# Patient Record
Sex: Female | Born: 1990
Health system: Southern US, Community
[De-identification: ages and names within clinical notes are randomized; demographics above are authoritative.]

---
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
## 2008-11-01 ENCOUNTER — Emergency Department (HOSPITAL_COMMUNITY): Admission: EM | Admit: 2008-11-01 | Discharge: 2008-11-01 | Payer: Self-pay | Admitting: Emergency Medicine

## 2010-07-30 ENCOUNTER — Emergency Department (HOSPITAL_COMMUNITY): Payer: No Typology Code available for payment source

## 2010-07-30 ENCOUNTER — Emergency Department (HOSPITAL_COMMUNITY)
Admission: EM | Admit: 2010-07-30 | Discharge: 2010-07-30 | Disposition: A | Discharge status: Home / Self Care | Payer: No Typology Code available for payment source | Attending: Emergency Medicine | Admitting: Emergency Medicine

## 2010-07-30 DIAGNOSIS — S239XXA Sprain of unspecified parts of thorax, initial encounter: Secondary | ICD-10-CM | POA: Insufficient documentation

## 2010-07-30 DIAGNOSIS — M546 Pain in thoracic spine: Secondary | ICD-10-CM | POA: Insufficient documentation

## 2010-07-30 DIAGNOSIS — M542 Cervicalgia: Secondary | ICD-10-CM | POA: Insufficient documentation

## 2010-07-30 DIAGNOSIS — S139XXA Sprain of joints and ligaments of unspecified parts of neck, initial encounter: Secondary | ICD-10-CM | POA: Insufficient documentation

## 2012-10-17 ENCOUNTER — Emergency Department (HOSPITAL_COMMUNITY): Payer: 59

## 2012-10-17 ENCOUNTER — Emergency Department (HOSPITAL_COMMUNITY)
Admission: EM | Admit: 2012-10-17 | Discharge: 2012-10-17 | Disposition: A | Discharge status: Home / Self Care | Payer: 59 | Attending: Emergency Medicine | Admitting: Emergency Medicine

## 2012-10-17 DIAGNOSIS — K122 Cellulitis and abscess of mouth: Secondary | ICD-10-CM | POA: Insufficient documentation

## 2012-10-17 DIAGNOSIS — K029 Dental caries, unspecified: Secondary | ICD-10-CM | POA: Insufficient documentation

## 2012-10-17 DIAGNOSIS — Z3202 Encounter for pregnancy test, result negative: Secondary | ICD-10-CM | POA: Insufficient documentation

## 2012-10-17 LAB — POCT I-STAT, CHEM 8
Calcium, Ion: 1.21 mmol/L (ref 1.12–1.23)
HCT: 44 % (ref 36.0–46.0)
Hemoglobin: 15 g/dL (ref 12.0–15.0)
Sodium: 139 mEq/L (ref 135–145)
TCO2: 22 mmol/L (ref 0–100)

## 2012-10-17 LAB — PREGNANCY, URINE: Preg Test, Ur: NEGATIVE

## 2012-10-17 MED ORDER — HYDROCODONE-ACETAMINOPHEN 5-325 MG PO TABS: 1.0000 | ORAL_TABLET | ORAL | Status: DC | PRN

## 2012-10-17 MED ORDER — ONDANSETRON HCL 4 MG/2ML IJ SOLN
4.0000 mg | INTRAMUSCULAR | Status: AC
  Administered 2012-10-17: 4 mg via INTRAVENOUS
  Filled 2012-10-17: qty 2

## 2012-10-17 MED ORDER — IOHEXOL 300 MG/ML  SOLN
75.0000 mL | Freq: Once | INTRAMUSCULAR | Status: AC | PRN
  Administered 2012-10-17: 75 mL via INTRAVENOUS

## 2012-10-17 MED ORDER — MORPHINE SULFATE 4 MG/ML IJ SOLN
2.0000 mg | Freq: Once | INTRAMUSCULAR | Status: AC
  Administered 2012-10-17: 2 mg via INTRAVENOUS
  Filled 2012-10-17: qty 1

## 2012-10-17 MED ORDER — OXYCODONE-ACETAMINOPHEN 5-325 MG PO TABS
2.0000 | ORAL_TABLET | Freq: Once | ORAL | Status: AC
  Administered 2012-10-17: 2 via ORAL
  Filled 2012-10-17: qty 2

## 2012-10-17 MED ORDER — CLINDAMYCIN PHOSPHATE 300 MG/50ML IV SOLN
300.0000 mg | Freq: Once | INTRAVENOUS | Status: AC
  Administered 2012-10-17: 300 mg via INTRAVENOUS
  Filled 2012-10-17: qty 50

## 2012-10-17 MED ORDER — CLINDAMYCIN HCL 150 MG PO CAPS: 300.0000 mg | ORAL_CAPSULE | Freq: Three times a day (TID) | ORAL | Status: DC

## 2012-10-17 NOTE — ED Provider Notes (Signed)
CSN: [id number]     Arrival date & time 10/17/12  8413 History     First MD Initiated Contact with Patient 10/17/12 0757     Chief Complaint  Patient presents with  . Facial Swelling   (Consider location/radiation/quality/duration/timing/severity/associated sxs/prior Treatment) HPI Comments: Patient is a 22 year old female who presents for right-sided facial swelling and pain with onset yesterday. Patient states the symptoms have been constant and gradually worsening since onset. She describes the pain as throbbing in nature and nonradiating. Patient has tried Advil without relief of symptoms. She admits to associated right lower gingival tenderness and endorses a history of dental caries, but states that she has not had cavities filled; denies relationship with dentist. Patient denies associated fever, ear pain or discharge, inability to swallow, gingival bleeding, neck pain or stiffness, or trauma.  The history is provided by the patient. No language interpreter was used.   No past medical history on file. No past surgical history on file. No family history on file. History  Substance Use Topics  . Smoking status: Not on file  . Smokeless tobacco: Not on file  . Alcohol Use: Not on file   OB History   No data available     Review of Systems  Constitutional: Negative for fever.  HENT: Positive for facial swelling and dental problem (pain). Negative for ear pain, sore throat, trouble swallowing, neck pain, neck stiffness and ear discharge.   Eyes: Negative for discharge and redness.  Respiratory: Negative for shortness of breath.   Gastrointestinal: Negative for nausea and vomiting.  All other systems reviewed and are negative.   Allergies  Review of patient's allergies indicates not on file.  Home Medications   Current Outpatient Rx  Name  Route  Sig  Dispense  Refill  . ibuprofen (ADVIL,MOTRIN) 200 MG tablet   Oral   Take 400 mg by mouth every 6 (six) hours as needed  for pain.         . clindamycin (CLEOCIN) 150 MG capsule   Oral   Take 2 capsules (300 mg total) by mouth 3 (three) times daily. May dispense as 150mg  capsules   60 capsule   0   . HYDROcodone-acetaminophen (NORCO/VICODIN) 5-325 MG per tablet   Oral   Take 1-2 tablets by mouth every 4 (four) hours as needed for pain.   20 tablet   0    BP 109/65  Pulse 53  Temp(Src) 99.3 F (37.4 C) (Oral)  Resp 18  Ht 5\' 4"  (1.626 m)  SpO2 100%  LMP 09/30/2012  Physical Exam  Constitutional: She is oriented to person, place, and time. She appears well-developed and well-nourished. No distress.  HENT:  Head: Normocephalic and atraumatic. There is trismus in the jaw.  Right Ear: Tympanic membrane, external ear and ear canal normal. No tenderness. No mastoid tenderness.  Left Ear: Tympanic membrane, external ear and ear canal normal. No tenderness. No mastoid tenderness.  Nose: Nose normal.  Mouth/Throat: Uvula is midline and mucous membranes are normal. Abnormal dentition. Dental caries present. No edematous. No oropharyngeal exudate, posterior oropharyngeal edema, posterior oropharyngeal erythema or tonsillar abscesses.    Eyes: Conjunctivae and EOM are normal. Pupils are equal, round, and reactive to light. No scleral icterus.  Neck: Normal range of motion. Neck supple. No tracheal deviation present.  Cardiovascular: Normal rate, regular rhythm and normal heart sounds.   Pulmonary/Chest: Effort normal and breath sounds normal. No stridor. No respiratory distress. She has no wheezes. She has no  rales.  Musculoskeletal: Normal range of motion.  Lymphadenopathy:    She has cervical adenopathy (R anterior cervical).  Neurological: She is alert and oriented to person, place, and time.  Skin: Skin is warm and dry. No rash noted. She is not diaphoretic. No erythema. No pallor.  Psychiatric: She has a normal mood and affect. Her behavior is normal.   ED Course   Procedures (including  critical care time)  Labs Reviewed  PREGNANCY, URINE  POCT I-STAT, CHEM 8   Ct Maxillofacial W/cm  10/17/2012   *RADIOLOGY REPORT*  Clinical Data: Right lower facial swelling  CT MAXILLOFACIAL WITH CONTRAST  Technique:  Multidetector CT imaging of the maxillofacial structures was performed with intravenous contrast. Multiplanar CT image reconstructions were also generated.  Contrast: 75mL OMNIPAQUE IOHEXOL 300 MG/ML  SOLN  Comparison: Cervical spine radiographs 07/30/2010  Findings: Partial imaging of the brain demonstrates no focal abnormality.  Globes and orbits are intact.  Diffuse skin thickening, edema and reticulation in the subcutaneous fat overlying the right mandible and extending upward along the mandibular ramus and mastoid air musculature consistent with diffuse cellulitis.  The epicenter of the soft tissue swelling appears to be of the right 2nd molar which is diffusely carious containing floccular material within the crown.  Additionally, there is focal peri apical lucency about the root which breaks through the medial cortex of the mandibular ramus consistent with periodontal disease.  Small amount of developing low attenuation with a peripheral rim of enhancement about the right mandible in the region of the second molar consistent with phlegmon or early abscess development. Numerous additional carious teeth, but no other areas of significant periodontal disease.  IMPRESSION:  1. Diffuse soft tissue cellulitis and edema involving the right perimandibular soft tissues extending upward along the masseter muscle.  The infectious process appears to be emanating from a carious right second maxillary molar with associated periodontal disease breaking through the medial cortex of the mandible.  Small amount of adjacent focal phlegmon versus early developing abscess.  2.  Multiple additional carious teeth without associated periodontal disease.   Original Report Authenticated By: Malachy Moan, M.D.    1. Cellulitis and abscess of oral soft tissues    MDM  R facial swelling; worsening x 2 days. No fever. Exam significant for TTP of R lower 2nd and 1st molars as well as surrounding gingiva. +Trismus. CT maxillofacial ordered.  CT significiant for perimandibular soft tissue cellulitis extending along masseter muscle; eminating from carious R 2nd molar. Findings suggestive of possible early developing abscess. Will consult oral surgery to arrange close outpatient f/u. Anticipate d/c home with necessary referral and follow up as well as course of PO clindamycin.  Have consulted with Dr. Jeanice Lim who has advised patient call office at [facility] Monday for follow up in office later that day; agrees patient appropriate for d/c. Patient appropriate for d/c with Rx for Cleocin for cellulitis and Norco for pain control. Indications for return provided and discussed and patient agreeable to plan.   Antony Madura, PA-C 10/18/12 1600

## 2012-10-17 NOTE — ED Notes (Signed)
Pt c/o R facial swelling & pain onset yesterday, denies difficulty swollowing or throat pain, tonsils non red & no swelling present, all teeth intact, R lower gum pain, no gum swelling, areas around teeth appear red, gums have some white patches in places around gum line on lower gum

## 2012-10-19 NOTE — ED Provider Notes (Signed)
Medical screening examination/treatment/procedure(s) were performed by non-physician practitioner and as supervising physician I was immediately available for consultation/collaboration.  Flint Melter, MD 10/19/12 1007

## 2013-02-17 ENCOUNTER — Ambulatory Visit (INDEPENDENT_AMBULATORY_CARE_PROVIDER_SITE_OTHER): Payer: Self-pay | Admitting: Emergency Medicine

## 2013-02-17 VITALS — BP 120/76 | HR 66 | Temp 98.0°F | Resp 16 | Ht 66.0 in | Wt 171.4 lb

## 2013-02-17 DIAGNOSIS — H109 Unspecified conjunctivitis: Secondary | ICD-10-CM

## 2013-02-17 MED ORDER — OFLOXACIN 0.3 % OP SOLN: 1.0000 [drp] | OPHTHALMIC | Status: DC

## 2013-02-17 NOTE — Patient Instructions (Signed)

## 2013-02-17 NOTE — Progress Notes (Signed)
   Subjective:  This chart was scribed for Viviann Spare A. Cleta Alberts, MD  by Ashley Jacobs, [facility] Scribe. The patient was seen in room and the patient's care was started at 3:22 PM  Patient ID: Danasha Melman, female    DOB: 1990-03-14, 22 y.o.   MRN: [id number]  HPI HPI Comments: Rashida Ladouceur is a 22 y.o. female who arrives to the [facility] complaining of left eye swelling past two days. She states feeling itching and burning around her eye and "inside" of her eye.Pt is also experiencing eye drainage. She has tried Clear Eye without any relief. Pt had an URI two weeks ago. She denies any disturbances to her vision. Pt smokes everyday.    Review of Systems      Objective:   Physical Exam are equal and reactive to light there is mild injection of the conjunctiva of the right the right lower lid is somewhat swollen. 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 There is no preauricular node fluorescin staining was negative.        Assessment & Plan:  Patient will be treated with Ocuflox. She was advised this may take 5-7 days to resolve. If she is not feeling better by Monday she is to call and we can make referral to the ophthalmologist .

## 2013-02-19 ENCOUNTER — Telehealth: Payer: Self-pay

## 2013-02-19 NOTE — Telephone Encounter (Signed)
Patient says that her eye is still red from pink eye and needs and extension on her work note please call her at [phone]

## 2013-02-22 NOTE — Telephone Encounter (Signed)
What dates does she need? Left message for her to call me back.

## 2013-02-24 NOTE — Telephone Encounter (Signed)
Patient has not returned calls.

## 2013-09-03 IMAGING — CT CT MAXILLOFACIAL W/ CM
3 of 4 series · 15 of 47 positions shown, 18 images · IV contrast (omnipaque)
Comparison: Cervical spine radiographs 07/30/2010

CLINICAL DATA: Right lower facial swelling

CT MAXILLOFACIAL WITH CONTRAST
TECHNIQUE: Multidetector CT imaging of the maxillofacial
structures was performed with intravenous contrast. Multiplanar CT
image reconstructions were also generated.
Contrast: 75mL OMNIPAQUE IOHEXOL 300 MG/ML  SOLN

[Series 2: facial/ orbits 2.0 h30s · axial · 0.36mm/px · z∈[-214,-64]mm · 11 of 87 slices shown, 14 images]
[im 6/87  brain]
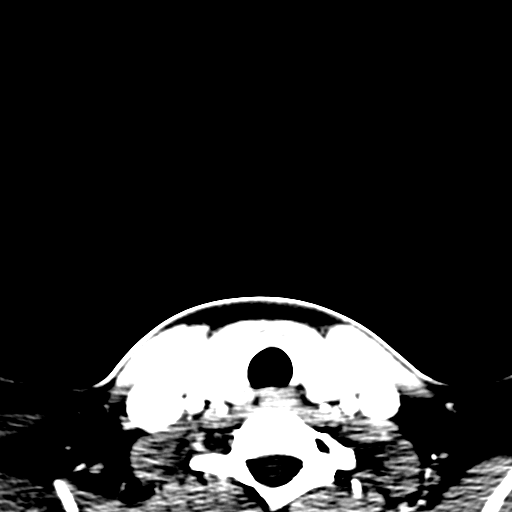
[im 6/87  bone]
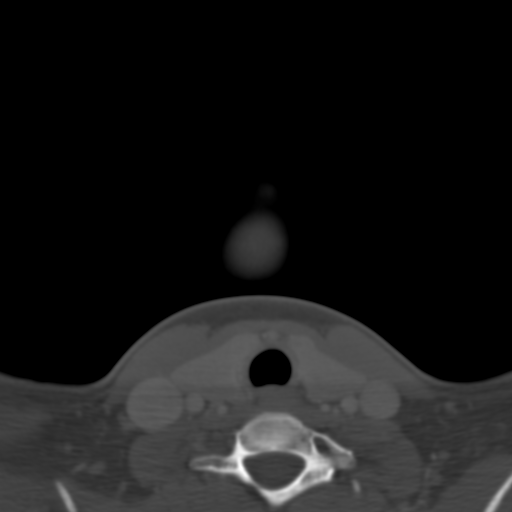
[im 12/87  bone]
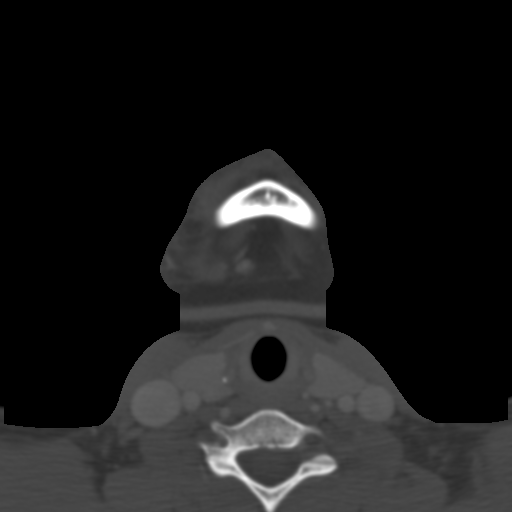
[im 21/87  bone]
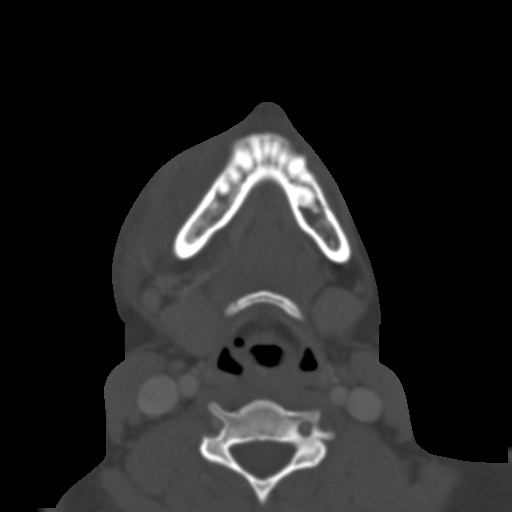
[im 27/87  bone]
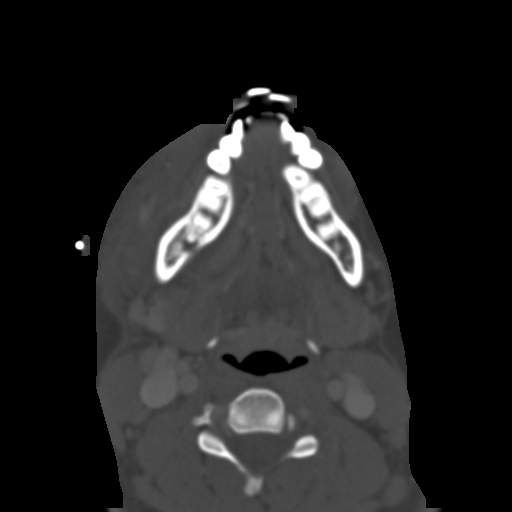
[im 36/87  brain]
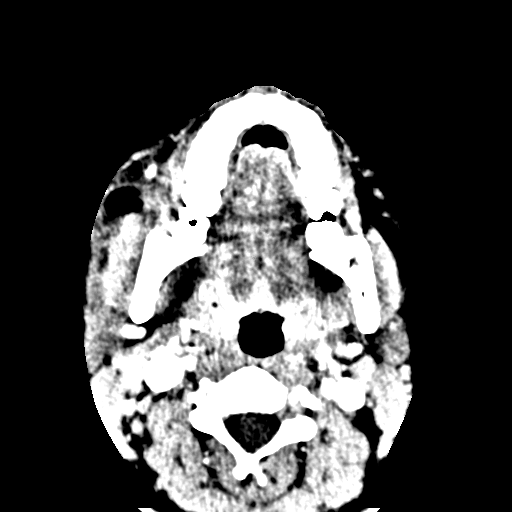
[im 36/87  bone]
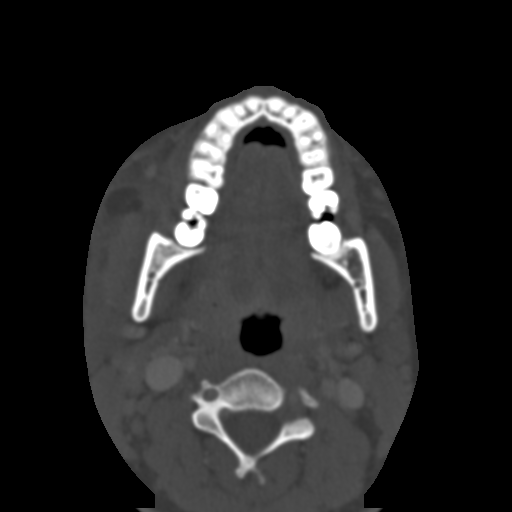
[im 45/87  bone]
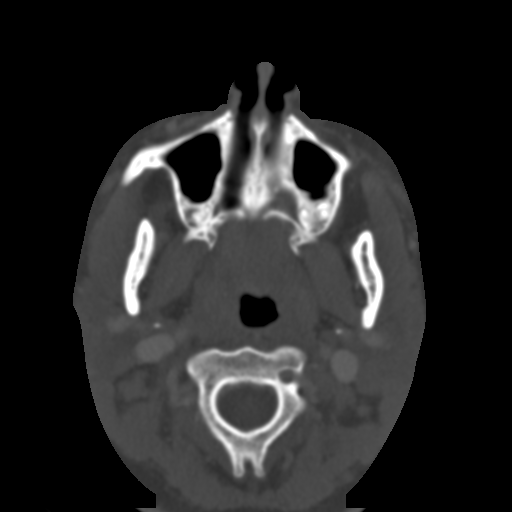
[im 51/87  bone]
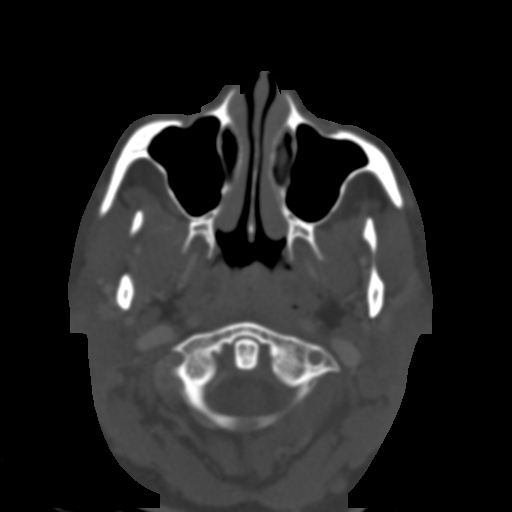
[im 60/87  bone]
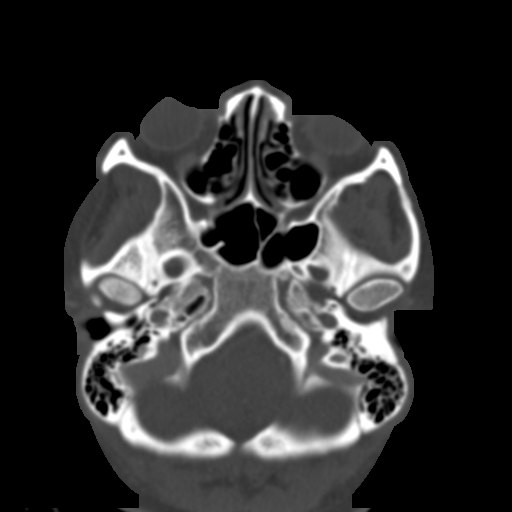
[im 66/87  brain]
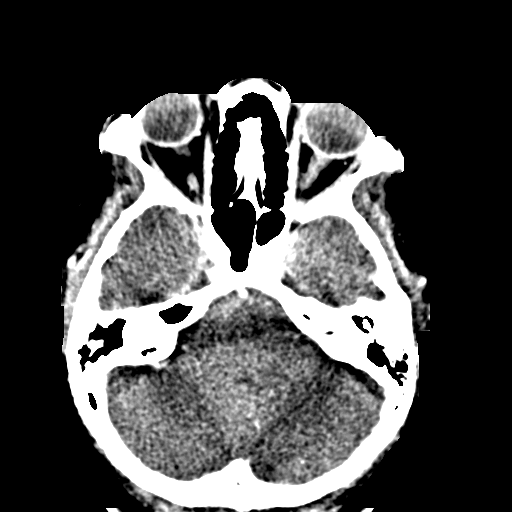
[im 66/87  bone]
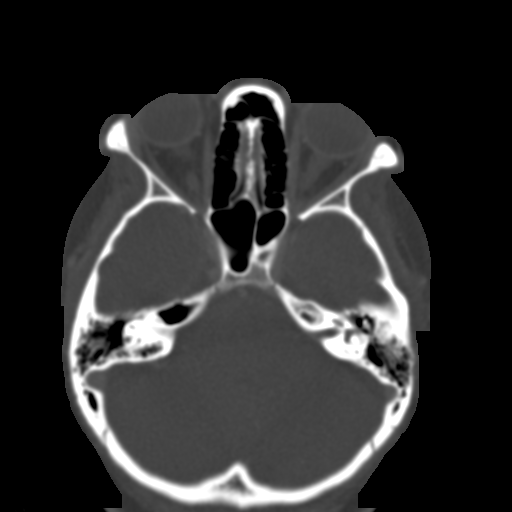
[im 75/87  bone]
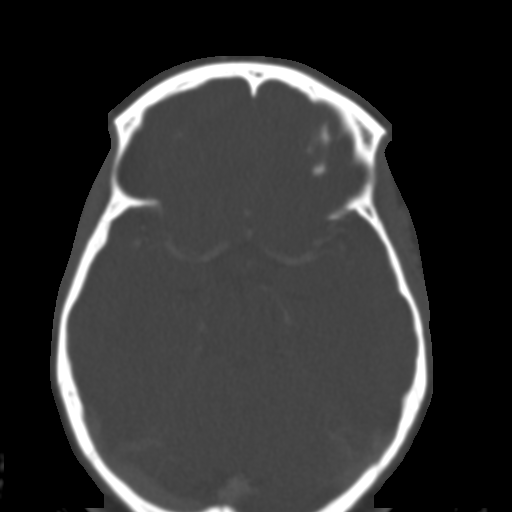
[im 81/87  bone]
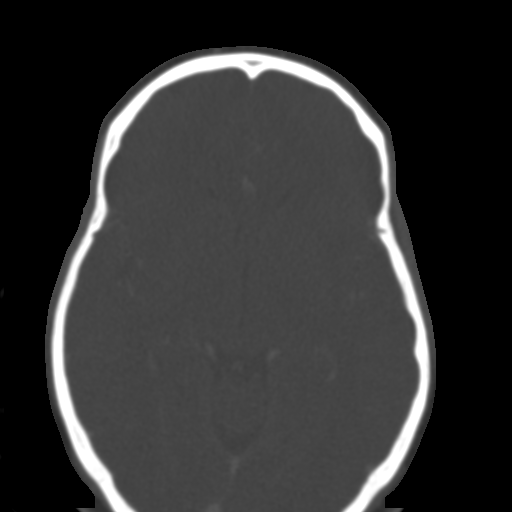

[Series 4: coronal st · coronal · 0.34mm/px · 3 of 78 slices shown]
[im 26/78  bone]
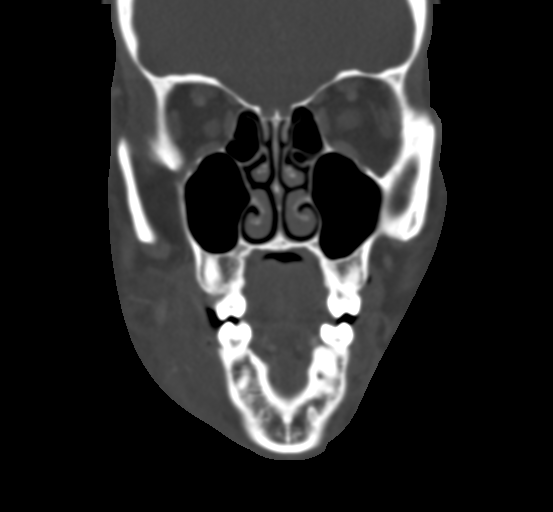
[im 35/78  bone]
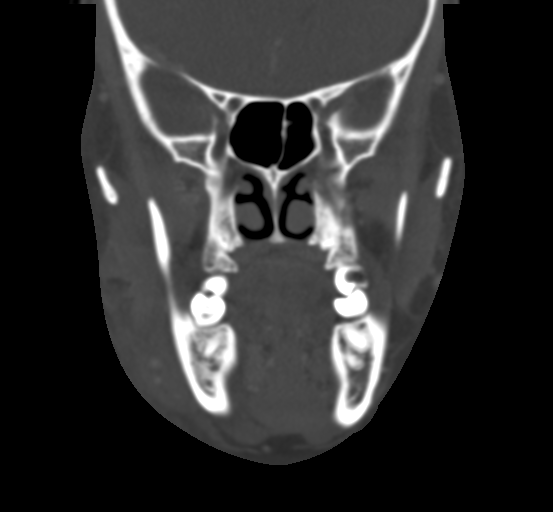
[im 43/78  bone]
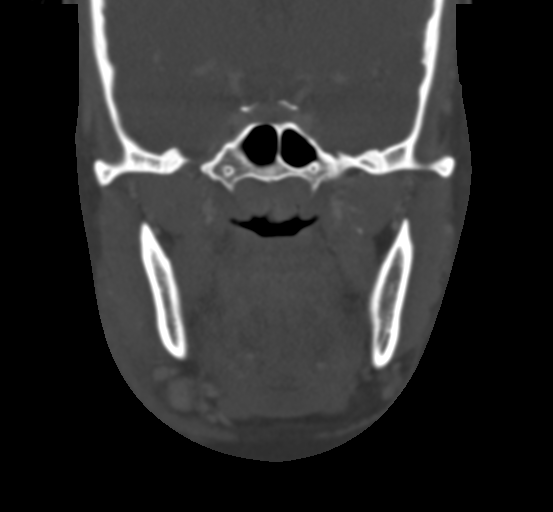

[Series 7: sagittal bone · sagittal · 0.34mm/px · 1 of 80 slices shown]
[im 40/80  bone]
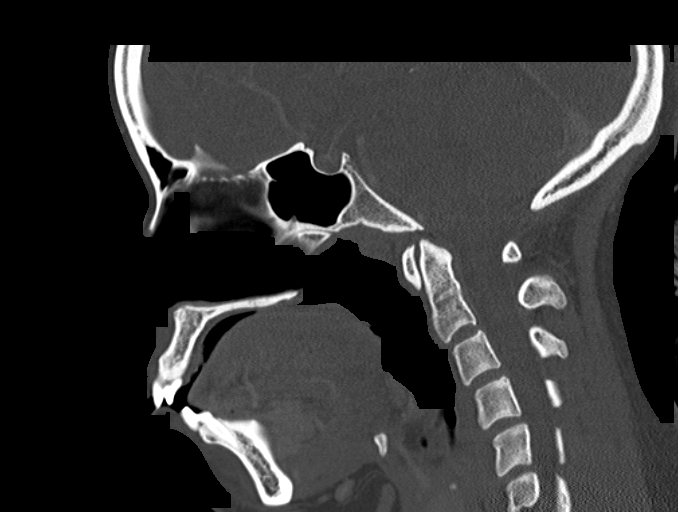

[15 of 47 positions shown; findings below may reference images not displayed]

FINDINGS: Partial imaging of the brain demonstrates no focal
abnormality.  Globes and orbits are intact.  Diffuse skin
thickening, edema and reticulation in the subcutaneous fat
overlying the right mandible and extending upward along the
mandibular ramus and mastoid air musculature consistent with
diffuse cellulitis.  The epicenter of the soft tissue swelling
appears to be of the right 2nd molar which is diffusely carious
containing floccular material within the crown.  Additionally,
there is focal peri apical lucency about the root which breaks
through the medial cortex of the mandibular ramus consistent with
periodontal disease.

Small amount of developing low attenuation with a peripheral rim of
enhancement about the right mandible in the region of the second
molar consistent with phlegmon or early abscess development.
Numerous additional carious teeth, but no other areas of
significant periodontal disease.
IMPRESSION: 1. Diffuse soft tissue cellulitis and edema involving the right
perimandibular soft tissues extending upward along the masseter
muscle.

The infectious process appears to be emanating from a carious right
second maxillary molar with associated periodontal disease breaking
through the medial cortex of the mandible.

Small amount of adjacent focal phlegmon versus early developing
abscess.

2.  Multiple additional carious teeth without associated
periodontal disease.

## 2013-09-23 ENCOUNTER — Encounter (HOSPITAL_COMMUNITY): Payer: Self-pay | Admitting: Emergency Medicine

## 2013-09-23 ENCOUNTER — Emergency Department (HOSPITAL_COMMUNITY)
Admission: EM | Admit: 2013-09-23 | Discharge: 2013-09-23 | Disposition: A | Discharge status: Home / Self Care | Payer: 59 | Attending: Emergency Medicine | Admitting: Emergency Medicine

## 2013-09-23 DIAGNOSIS — G479 Sleep disorder, unspecified: Secondary | ICD-10-CM | POA: Insufficient documentation

## 2013-09-23 DIAGNOSIS — K089 Disorder of teeth and supporting structures, unspecified: Secondary | ICD-10-CM | POA: Insufficient documentation

## 2013-09-23 DIAGNOSIS — K0889 Other specified disorders of teeth and supporting structures: Secondary | ICD-10-CM

## 2013-09-23 DIAGNOSIS — F17200 Nicotine dependence, unspecified, uncomplicated: Secondary | ICD-10-CM | POA: Insufficient documentation

## 2013-09-23 MED ORDER — HYDROCODONE-ACETAMINOPHEN 5-325 MG PO TABS
2.0000 | ORAL_TABLET | Freq: Once | ORAL | Status: AC
  Administered 2013-09-23: 2 via ORAL
  Filled 2013-09-23: qty 2

## 2013-09-23 MED ORDER — PENICILLIN V POTASSIUM 250 MG PO TABS: 250.0000 mg | ORAL_TABLET | Freq: Four times a day (QID) | ORAL | Status: AC

## 2013-09-23 MED ORDER — HYDROCODONE-ACETAMINOPHEN 5-325 MG PO TABS: 1.0000 | ORAL_TABLET | Freq: Four times a day (QID) | ORAL | Status: DC | PRN

## 2013-09-23 NOTE — ED Provider Notes (Signed)
Medical screening examination/treatment/procedure(s) were performed by non-physician practitioner and as supervising physician I was immediately available for consultation/collaboration.   EKG Interpretation None        Tonnie Stillman, MD 09/23/13 0533 

## 2013-09-23 NOTE — ED Provider Notes (Signed)
CSN: [id number]     Arrival date & time 09/23/13  0146 History   First MD Initiated Contact with Patient 09/23/13 [phone]     Chief Complaint  Patient presents with  . Dental Pain     (Consider location/radiation/quality/duration/timing/severity/associated sxs/prior Treatment) HPI Comments: Patient presents to the emergency department with a dental complaint. Symptoms began 2 days ago. The patient has tried to alleviate pain with OTC meds.  Pain rated at a 10/10, characterized as throbbing in nature and located right lower rear molar. Patient denies fever, night sweats, chills, difficulty swallowing or opening mouth, SOB, nuchal rigidity or decreased ROM of neck.  Patient does not have a dentist and requests a resource guide at discharge.   The history is provided by the patient. No language interpreter was used.    History reviewed. No pertinent past medical history. History reviewed. No pertinent past surgical history. No family history on file. History  Substance Use Topics  . Smoking status: Current Every Day Smoker  . Smokeless tobacco: Not on file  . Alcohol Use: Yes   OB History   Grav Para Term Preterm Abortions TAB SAB Ect Mult Living                 Review of Systems  Constitutional: Negative for fever and chills.  HENT: Positive for dental problem. Negative for drooling.   Neurological: Negative for speech difficulty.  Psychiatric/Behavioral: Positive for sleep disturbance.      Allergies  Review of patient's allergies indicates no known allergies.  Home Medications   Prior to Admission medications   Medication Sig Start Date End Date Taking? Authorizing Provider  OVER THE COUNTER MEDICATION Take 1 tablet by mouth daily as needed (pain).   Yes Historical Provider, MD   BP 109/68  Pulse 61  Temp(Src) 98.3 F (36.8 C) (Oral)  Resp 14  Ht 5\' 4"  (1.626 m)  Wt 173 lb (78.472 kg)  BMI 29.68 kg/m2  SpO2 100%  LMP 08/29/2013 Physical Exam  Nursing note and  vitals reviewed. Constitutional: She is oriented to person, place, and time. She appears well-developed and well-nourished.  HENT:  Head: Normocephalic and atraumatic.  Mouth/Throat:    Poor dentition throughout.  Affected tooth as diagrammed.  No signs of peritonsillar or tonsillar abscess.  No signs of gingival abscess. Oropharynx is clear and without exudates.  Uvula is midline.  Airway is intact. No signs of Ludwig's angina with palpation of oral and sublingual mucosa.   Eyes: Conjunctivae and EOM are normal.  Neck: Normal range of motion.  Cardiovascular: Normal rate.   Pulmonary/Chest: Effort normal.  Abdominal: She exhibits no distension.  Musculoskeletal: Normal range of motion.  Neurological: She is alert and oriented to person, place, and time.  Skin: Skin is dry.  Psychiatric: She has a normal mood and affect. Her behavior is normal. Judgment and thought content normal.    ED Course  Procedures (including critical care time) Labs Review Labs Reviewed - No data to display  Imaging Review No results found.   EKG Interpretation None      MDM   Final diagnoses:  Pain, dental    Patient with toothache.  No gross abscess.  Exam unconcerning for Ludwig's angina or spread of infection.  Will treat with penicillin and pain medicine.  Urged patient to follow-up with dentist.      Roxy Horsemanobert Kalkidan Caudell, PA-C 09/23/13 0505

## 2013-09-23 NOTE — Discharge Instructions (Signed)

## 2013-09-23 NOTE — ED Notes (Signed)
Pt. reports right lower molar pain onset 2 days ago unrelieved by OTC pain medication .

## 2015-02-04 ENCOUNTER — Encounter (HOSPITAL_COMMUNITY): Payer: Self-pay | Admitting: Neurology

## 2015-02-04 ENCOUNTER — Emergency Department (HOSPITAL_COMMUNITY)
Admission: EM | Admit: 2015-02-04 | Discharge: 2015-02-04 | Disposition: A | Discharge status: Home / Self Care | Payer: Self-pay | Attending: Emergency Medicine | Admitting: Emergency Medicine

## 2015-02-04 DIAGNOSIS — F17200 Nicotine dependence, unspecified, uncomplicated: Secondary | ICD-10-CM | POA: Insufficient documentation

## 2015-02-04 DIAGNOSIS — R600 Localized edema: Secondary | ICD-10-CM | POA: Insufficient documentation

## 2015-02-04 DIAGNOSIS — K029 Dental caries, unspecified: Secondary | ICD-10-CM | POA: Insufficient documentation

## 2015-02-04 DIAGNOSIS — K0889 Other specified disorders of teeth and supporting structures: Secondary | ICD-10-CM | POA: Insufficient documentation

## 2015-02-04 DIAGNOSIS — R51 Headache: Secondary | ICD-10-CM | POA: Insufficient documentation

## 2015-02-04 DIAGNOSIS — Z791 Long term (current) use of non-steroidal anti-inflammatories (NSAID): Secondary | ICD-10-CM | POA: Insufficient documentation

## 2015-02-04 DIAGNOSIS — K0381 Cracked tooth: Secondary | ICD-10-CM | POA: Insufficient documentation

## 2015-02-04 MED ORDER — IBUPROFEN 800 MG PO TABS: 800.0000 mg | ORAL_TABLET | Freq: Three times a day (TID) | ORAL | Status: DC

## 2015-02-04 MED ORDER — PENICILLIN V POTASSIUM 500 MG PO TABS: 500.0000 mg | ORAL_TABLET | Freq: Four times a day (QID) | ORAL | Status: AC

## 2015-02-04 NOTE — Discharge Instructions (Signed)
Follow-up with Dr. Mayford Knifeurner on Monday for further evaluation and management of your dental pain. Take your antibiotics as prescribed. Take Motrin as needed for dental pain. Return to ED for worsening symptoms.

## 2015-02-04 NOTE — ED Provider Notes (Signed)
CSN: [id number]     Arrival date & time 02/04/15  1229 History  By signing my name below, I, Soijett Blue, attest that this documentation has been prepared under the direction and in the presence of Mayme Genta, [employer] Electronically Signed: Soijett Blue, ED Scribe. 02/04/2015. 1:37 PM.   Chief Complaint  Patient presents with  . Dental Pain      The history is provided by the patient. No language interpreter was used.    Anne Thompson is a 24 y.o. female who presents to the Emergency Department complaining of 9/10, intermittent dental pain onset 3 days. She states that she is having associated symptoms of mild facial swelling and HA. She states that she has tried aleve with mild relief for her symptoms. She denies drainage, abdominal pain, n/v, sore throat, fever, chills, voice change, trismus, and any other symptoms. Pt denies allergies to any medications.    History reviewed. No pertinent past medical history. History reviewed. No pertinent past surgical history. No family history on file. Social History  Substance Use Topics  . Smoking status: Current Every Day Smoker  . Smokeless tobacco: None  . Alcohol Use: Yes   OB History    No data available     Review of Systems  Constitutional: Negative for fever and chills.  HENT: Positive for dental problem and facial swelling. Negative for trouble swallowing.   Gastrointestinal: Negative for nausea, vomiting and anal bleeding.  Neurological: Positive for headaches.      Allergies  Review of patient's allergies indicates no known allergies.  Home Medications   Prior to Admission medications   Medication Sig Start Date End Date Taking? Authorizing Provider  HYDROcodone-acetaminophen (NORCO/VICODIN) 5-325 MG per tablet Take 1-2 tablets by mouth every 6 (six) hours as needed. 09/23/13   Roxy Horseman, PA-C  ibuprofen (ADVIL,MOTRIN) 800 MG tablet Take 1 tablet (800 mg total) by mouth 3 (three) times daily. 02/04/15   Joycie Peek, PA-C  OVER THE COUNTER MEDICATION Take 1 tablet by mouth daily as needed (pain).    Historical Provider, MD  penicillin v potassium (VEETID) 500 MG tablet Take 1 tablet (500 mg total) by mouth 4 (four) times daily. 02/04/15 02/11/15  Huberta Tompkins, PA-C   BP 102/61 mmHg  Pulse 64  Temp(Src) 97.8 F (36.6 C) (Oral)  Resp 14  SpO2 97%  LMP 01/21/2015 Physical Exam  Constitutional: She is oriented to person, place, and time. She appears well-developed and well-nourished. No distress.  HENT:  Head: Normocephalic and atraumatic.  Mouth/Throat: Oropharynx is clear and moist. No trismus in the jaw. Abnormal dentition. Dental caries present. No dental abscesses.  Overall poor dentition with multiple fractured teeth and active caries. Discomfort located to the left mandibular molars. No evidence of obvious abscess. No Ludwig's angina. No glossal elevation. No trismus. No obvious abscess. Patent airway.   Eyes: EOM are normal.  Neck: Neck supple.  Cardiovascular: Normal rate, regular rhythm and normal heart sounds.  Exam reveals no gallop and no friction rub.   No murmur heard. Pulmonary/Chest: Effort normal and breath sounds normal. No respiratory distress. She has no wheezes. She has no rales.  Abdominal: Soft. There is no tenderness.  Musculoskeletal: Normal range of motion.  Lymphadenopathy:    She has no cervical adenopathy.  Neurological: She is alert and oriented to person, place, and time.  Skin: Skin is warm and dry. She is not diaphoretic.  Psychiatric: She has a normal mood and affect. Her behavior is normal.  Nursing note and vitals reviewed.   ED Course  Procedures (including critical care time) DIAGNOSTIC STUDIES: Oxygen Saturation is 97% on RA, nl by my interpretation.    COORDINATION OF CARE: 1:34 PM Discussed treatment plan with pt at bedside which includes motrin Rx, abx Rx, referral to dentist and pt agreed to plan.  1:34 PM- Pt was offered an oral dental  block by Mayme GentaBen Adesuwa Osgood, PA-C to which she declined.    Labs Review Labs Reviewed - No data to display  Imaging Review No results found. I have personally reviewed and evaluated these images and lab results as part of my medical decision-making.   EKG Interpretation None     Medications - No data to display Filed Vitals:   02/04/15 1238  BP: 102/61  Pulse: 64  Temp: 97.8 F (36.6 C)  TempSrc: Oral  Resp: 14  SpO2: 97%   Meds given in ED:  Medications - No data to display  New Prescriptions   IBUPROFEN (ADVIL,MOTRIN) 800 MG TABLET    Take 1 tablet (800 mg total) by mouth 3 (three) times daily.   PENICILLIN V POTASSIUM (VEETID) 500 MG TABLET    Take 1 tablet (500 mg total) by mouth 4 (four) times daily.     MDM  Tyler AasBrianca Brunke is a 24 y.o. female who presents with dental pain onset 3 days ago. No dentist. No evidence of abscess, retropharyngeal or peritonsillar abscess, Ludwig angina. Patient refuses oral nerve block. Will DC with antibiotics and ibuprofen. Referral to dentistry given, encouraged to call on Monday for follow-up and definitive care. Patient agrees with this plan. No evidence of other acute or emergent pathology. Patient appears well, nontoxic, hemodynamically stable and afebrile. Appropriate for discharge. Final diagnoses:  Pain, dental      Joycie PeekBenjamin Teana Lindahl, PA-C 02/04/15 1407  Pricilla LovelessScott Goldston, MD 02/04/15 [phone]

## 2015-02-04 NOTE — ED Notes (Signed)
Pt is in stable condition upon d/c and ambulates from ED. Upon d/c pt is very upset and states, "So, you aren't gonna offer me anything for pain?". The pt was informed she was offered a nerve block to take away her pain for up to 8 hours. Pt states she didn't want any needles she wanted to take a pill for her pain. Pt was offered ibuprofen, which the pt refused and states she wanted something stronger for pain.

## 2015-02-04 NOTE — ED Notes (Signed)
Pt reports left upper and lower toothache since Thursday. Is having a lot of pain, has not seen a dentist.

## 2016-09-03 ENCOUNTER — Encounter (HOSPITAL_BASED_OUTPATIENT_CLINIC_OR_DEPARTMENT_OTHER): Payer: Self-pay | Admitting: *Deleted

## 2016-09-03 ENCOUNTER — Emergency Department (HOSPITAL_BASED_OUTPATIENT_CLINIC_OR_DEPARTMENT_OTHER)
Admission: EM | Admit: 2016-09-03 | Discharge: 2016-09-03 | Disposition: A | Discharge status: Home / Self Care | Payer: Self-pay | Attending: Emergency Medicine | Admitting: Emergency Medicine

## 2016-09-03 DIAGNOSIS — F17200 Nicotine dependence, unspecified, uncomplicated: Secondary | ICD-10-CM | POA: Insufficient documentation

## 2016-09-03 DIAGNOSIS — K0889 Other specified disorders of teeth and supporting structures: Secondary | ICD-10-CM | POA: Insufficient documentation

## 2016-09-03 MED ORDER — IBUPROFEN 800 MG PO TABS: 800.0000 mg | ORAL_TABLET | Freq: Three times a day (TID) | ORAL | 0 refills | Status: DC

## 2016-09-03 MED ORDER — PENICILLIN V POTASSIUM 500 MG PO TABS: 500.0000 mg | ORAL_TABLET | Freq: Four times a day (QID) | ORAL | 0 refills | Status: AC

## 2016-09-03 MED FILL — PENICILLIN VK 500 MG TABLET: 500 | 7 days supply | Qty: 28 | Fill #0

## 2016-09-03 MED FILL — IBUPROFEN 800 MG TABLET: 800 | 7 days supply | Qty: 21 | Fill #0

## 2016-09-03 NOTE — ED Triage Notes (Signed)
Pt c/o dental pain  X 1 week

## 2016-09-03 NOTE — ED Notes (Signed)
ED Provider at bedside. 

## 2016-09-03 NOTE — ED Provider Notes (Signed)
[facility] Provider [facility]   CSN: [id number] Arrival date & time: 09/03/16  1329     History   Chief Complaint Chief Complaint  Patient presents with  . Dental Pain    HPI Anne Thompson is a 26 y.o. female presenting with 1 week dental pain.  Patient states that for the past few years she has this dental pain about twice a year, but this most recent event started about a week ago. Her pain is located in her bilateral lower molars. Pain is worse with biting. She has no pain when opening her mouth. Patient has seen an oral surgeon in the past but seems to have surgery to fix her problem, but she did not have money at the time for the surgery. She states that every time she has panic that she comes to the emergency room where she gets an antibiotic and pain medicine. She denies fever, chills, voice changes, neck pain/stiffness, difficulty swallowing, or shortness of breath.    HPI  History reviewed. No pertinent past medical history.  There are no active problems to display for this patient.   History reviewed. No pertinent surgical history.  OB History    No data available       Home Medications    Prior to Admission medications   Medication Sig Start Date End Date Taking? Authorizing Provider  ibuprofen (ADVIL,MOTRIN) 800 MG tablet Take 1 tablet (800 mg total) by mouth 3 (three) times daily. 09/03/16   Ashely Goosby, PA-C  penicillin v potassium (VEETID) 500 MG tablet Take 1 tablet (500 mg total) by mouth 4 (four) times daily. 09/03/16 09/10/16  Wren Gallaga, PA-C    Family History No family history on file.  Social History Social History  Substance Use Topics  . Smoking status: Current Every Day Smoker    Packs/day: 0.50  . Smokeless tobacco: Never Used  . Alcohol use Yes     Allergies   Patient has no known allergies.   Review of Systems Review of Systems  Constitutional: Negative for chills and fever.  HENT: Positive for dental  problem. Negative for ear pain, facial swelling, sinus pain, sinus pressure, sore throat, trouble swallowing and voice change.   Respiratory: Negative for cough and shortness of breath.   Cardiovascular: Negative for chest pain.  Gastrointestinal: Negative for nausea and vomiting.     Physical Exam Updated Vital Signs BP 99/73   Pulse 64   Temp 99 F (37.2 C)   Resp 18   Ht 5\' 4"  (1.626 m)   Wt 82.6 kg (182 lb)   LMP 08/09/2016   SpO2 100%   BMI 31.24 kg/m   Physical Exam  Constitutional: She appears well-developed and well-nourished. No distress.  HENT:  Head: Normocephalic and atraumatic.  Right Ear: Tympanic membrane, external ear and ear canal normal.  Left Ear: Tympanic membrane, external ear and ear canal normal.  Nose: Nose normal. Right sinus exhibits no maxillary sinus tenderness and no frontal sinus tenderness. Left sinus exhibits no maxillary sinus tenderness and no frontal sinus tenderness.  Mouth/Throat: Uvula is midline, oropharynx is clear and moist and mucous membranes are normal. No trismus in the jaw. Abnormal dentition. Dental caries present. No dental abscesses or uvula swelling. No oropharyngeal exudate, posterior oropharyngeal edema or posterior oropharyngeal erythema.  Patient with poor dentition, and misalignment of teeth on the lower jaw. On the left side, one bicuspid is more medial than the rest. There is a gap between molar and wisdom  tooth, and gingiva appears erythematous and slightly inflamed. On the right side, patient with gap between molar and wisdom tooth with possible partial abruption or tooth between. Tenderness of bilateral wisdom teeth and molars. No obvious signs of drainable abscess at this time.  Eyes: Conjunctivae and EOM are normal. Pupils are equal, round, and reactive to light.  Neck: Normal range of motion. Neck supple.  Cardiovascular: Normal rate and regular rhythm.   Pulmonary/Chest: Effort normal and breath sounds normal.    Lymphadenopathy:    She has no cervical adenopathy.  Skin: She is not diaphoretic.     ED Treatments / Results  Labs (all labs ordered are listed, but only abnormal results are displayed) Labs Reviewed - No data to display  EKG  EKG Interpretation None       Radiology No results found.  Procedures Procedures (including critical care time)  Medications Ordered in ED Medications - No data to display   Initial Impression / Assessment and Plan / ED Course  I have reviewed the triage vital signs and the nursing notes.  Pertinent labs & imaging results that were available during my care of the patient were reviewed by me and considered in my medical decision making (see chart for details).    Patient with dental pain, and has been evaluated by oral surgeon in the past and been told she needs surgery to prevent future pain. She has not had this done yet due to cost. No sign of abscess, trismus, or Ludwigs angina. No systemic fever or chills. As gingiva appears erythematous and inflamed, will prescribe antibiotic, and ibuprofen 800 for pain control. Patient urged to follow-up with dentition. Provided resources about dentists in the area as well as follow-up information. Return precautions given. Patient states she understands and agrees to plan.  Final Clinical Impressions(s) / ED Diagnoses   Final diagnoses:  Pain, dental    New Prescriptions Discharge Medication List as of 09/03/2016  3:28 PM    START taking these medications   Details  ibuprofen (ADVIL,MOTRIN) 800 MG tablet Take 1 tablet (800 mg total) by mouth 3 (three) times daily., Starting Tue 09/03/2016, Print    penicillin v potassium (VEETID) 500 MG tablet Take 1 tablet (500 mg total) by mouth 4 (four) times daily., Starting Tue 09/03/2016, Until Tue 09/10/2016, Print         [place]ieaccavale, OlowaluSophia, PA-C 09/03/16 1605    Jacalyn LefevreHaviland, Julie, MD 09/04/16 [phone]

## 2016-09-03 NOTE — Discharge Instructions (Signed)
Take antibiotics as prescribed. You may use ibuprofen 800 as needed for pain. Take this with meals. You may also alternate with Tylenol. Follow-up with dentistry in one week for further evaluation of your teeth pain. You may contact the dentist listed below, or use of the dentists listed in the resource guide. Return to the emergency department if you develop fever, chills, worsening pain, or difficulty swallowing.

## 2016-09-24 ENCOUNTER — Emergency Department (HOSPITAL_BASED_OUTPATIENT_CLINIC_OR_DEPARTMENT_OTHER): Payer: Self-pay

## 2016-09-24 ENCOUNTER — Encounter (HOSPITAL_BASED_OUTPATIENT_CLINIC_OR_DEPARTMENT_OTHER): Payer: Self-pay | Admitting: *Deleted

## 2016-09-24 ENCOUNTER — Emergency Department (HOSPITAL_BASED_OUTPATIENT_CLINIC_OR_DEPARTMENT_OTHER)
Admission: EM | Admit: 2016-09-24 | Discharge: 2016-09-24 | Disposition: A | Discharge status: Home / Self Care | Payer: Self-pay | Attending: Emergency Medicine | Admitting: Emergency Medicine

## 2016-09-24 DIAGNOSIS — R0789 Other chest pain: Secondary | ICD-10-CM | POA: Insufficient documentation

## 2016-09-24 DIAGNOSIS — R1030 Lower abdominal pain, unspecified: Secondary | ICD-10-CM | POA: Insufficient documentation

## 2016-09-24 DIAGNOSIS — Z79899 Other long term (current) drug therapy: Secondary | ICD-10-CM | POA: Insufficient documentation

## 2016-09-24 DIAGNOSIS — F17210 Nicotine dependence, cigarettes, uncomplicated: Secondary | ICD-10-CM | POA: Insufficient documentation

## 2016-09-24 DIAGNOSIS — N939 Abnormal uterine and vaginal bleeding, unspecified: Secondary | ICD-10-CM | POA: Insufficient documentation

## 2016-09-24 DIAGNOSIS — R102 Pelvic and perineal pain: Secondary | ICD-10-CM

## 2016-09-24 LAB — COMPREHENSIVE METABOLIC PANEL
ALBUMIN: 3.4 g/dL — AB (ref 3.5–5.0)
ALT: 11 U/L — ABNORMAL LOW (ref 14–54)
ANION GAP: 6 (ref 5–15)
AST: 17 U/L (ref 15–41)
Alkaline Phosphatase: 75 U/L (ref 38–126)
BILIRUBIN TOTAL: 0.2 mg/dL — AB (ref 0.3–1.2)
BUN: 12 mg/dL (ref 6–20)
CHLORIDE: 104 mmol/L (ref 101–111)
CO2: 22 mmol/L (ref 22–32)
Calcium: 8.7 mg/dL — ABNORMAL LOW (ref 8.9–10.3)
Creatinine, Ser: 0.62 mg/dL (ref 0.44–1.00)
GFR calc Af Amer: >60 mL/min (ref >60)
GFR calc non Af Amer: >60 mL/min (ref >60)
GLUCOSE: 84 mg/dL (ref 65–99)
POTASSIUM: 3.9 mmol/L (ref 3.5–5.1)
SODIUM: 132 mmol/L — AB (ref 135–145)
TOTAL PROTEIN: 6.8 g/dL (ref 6.5–8.1)

## 2016-09-24 LAB — CBC WITH DIFFERENTIAL/PLATELET
BASOS ABS: 0 10*3/uL (ref 0.0–0.1)
BASOS PCT: 0 %
EOS ABS: 0.1 10*3/uL (ref 0.0–0.7)
EOS PCT: 1 %
HCT: 29.6 % — ABNORMAL LOW (ref 36.0–46.0)
Hemoglobin: 9.5 g/dL — ABNORMAL LOW (ref 12.0–15.0)
Lymphocytes Relative: 31 %
Lymphs Abs: 2.1 10*3/uL (ref 0.7–4.0)
MCH: 24.3 pg — ABNORMAL LOW (ref 26.0–34.0)
MCHC: 32.1 g/dL (ref 30.0–36.0)
MCV: 75.7 fL — ABNORMAL LOW (ref 78.0–100.0)
MONO ABS: 0.6 10*3/uL (ref 0.1–1.0)
MONOS PCT: 9 %
NEUTROS ABS: 3.9 10*3/uL (ref 1.7–7.7)
Neutrophils Relative %: 59 %
PLATELETS: 349 10*3/uL (ref 150–400)
RBC: 3.91 MIL/uL (ref 3.87–5.11)
RDW: 19.2 % — AB (ref 11.5–15.5)
WBC: 6.6 10*3/uL (ref 4.0–10.5)

## 2016-09-24 LAB — URINALYSIS, ROUTINE W REFLEX MICROSCOPIC
Bilirubin Urine: NEGATIVE
GLUCOSE, UA: NEGATIVE mg/dL
HGB URINE DIPSTICK: NEGATIVE
KETONES UR: NEGATIVE mg/dL
Leukocytes, UA: NEGATIVE
Nitrite: NEGATIVE
PROTEIN: NEGATIVE mg/dL
Specific Gravity, Urine: 1.016 (ref 1.005–1.030)
pH: 6.5 (ref 5.0–8.0)

## 2016-09-24 LAB — TROPONIN I

## 2016-09-24 LAB — LIPASE, BLOOD: LIPASE: 23 U/L (ref 11–51)

## 2016-09-24 LAB — PREGNANCY, URINE: PREG TEST UR: NEGATIVE

## 2016-09-24 MED ORDER — TRAMADOL HCL 50 MG PO TABS: 50.0000 mg | ORAL_TABLET | Freq: Four times a day (QID) | ORAL | 0 refills | Status: AC | PRN

## 2016-09-24 MED FILL — traMADol HCL 50 MG TABS: 50 | 3 days supply | Qty: 15 | Fill #0

## 2016-09-24 NOTE — ED Triage Notes (Signed)
Patient states she had mid abdominal crampy pain two days ago and passed a large vaginal blood clot.  States the pain resolved the next day.  States today while at work, the crampy abdominal pain returned and now is radiating into her chest and is sob.  Patient is tearful and anxious.

## 2016-09-24 NOTE — ED Provider Notes (Signed)
[facility]   CSN: [id number] Arrival date & time: 09/24/16  0857     History   Chief Complaint Chief Complaint  Patient presents with  . Chest Pain    Abdominal pain    HPI Anne Thompson is a 26 y.o. female.  Patient is a 26 year old female with no significant past medical history. She presents today for evaluation of chest and abdominal pain. She started with mid abdominal cramping yesterday and reports passing a clot through her vagina. Her last menstrual period was 2 weeks ago and normal, so this is unusual for her. She denies any vaginal discharge or bleeding otherwise. She denies any fevers or chills. This morning she developed tightness in her chest with associated shortness of breath. She denies any recent exertional symptoms and has no cardiac risk factors. She denies any leg swelling or cramping.   The history is provided by the patient.  Chest Pain   This is a new problem. The current episode started yesterday. The problem occurs constantly. The problem has not changed since onset.The pain is present in the substernal region. The pain is mild. Quality: Tightness. The pain does not radiate. Associated symptoms include shortness of breath. Pertinent negatives include no cough, no diaphoresis, no nausea and no palpitations. She has tried nothing for the symptoms.    History reviewed. No pertinent past medical history.  There are no active problems to display for this patient.   History reviewed. No pertinent surgical history.  OB History    No data available       Home Medications    Prior to Admission medications   Medication Sig Start Date End Date Taking? Authorizing Provider  ibuprofen (ADVIL,MOTRIN) 800 MG tablet Take 1 tablet (800 mg total) by mouth 3 (three) times daily. 09/03/16  Yes Caccavale, Sophia, PA-C    Family History No family history on file.  Social History Social History  Substance Use Topics  . Smoking status:  Current Every Day Smoker    Packs/day: 0.50  . Smokeless tobacco: Never Used  . Alcohol use Yes     Comment: occassional     Allergies   Patient has no known allergies.   Review of Systems Review of Systems  Constitutional: Negative for diaphoresis.  Respiratory: Positive for shortness of breath. Negative for cough.   Cardiovascular: Positive for chest pain. Negative for palpitations.  Gastrointestinal: Negative for nausea.  All other systems reviewed and are negative.    Physical Exam Updated Vital Signs BP 108/82 (BP Location: Right Arm)   Pulse 63   Temp 98.2 F (36.8 C) (Oral)   Resp (!) 24   Ht 5\' 4"  (1.626 m)   Wt 81.6 kg (180 lb)   LMP 09/10/2016   SpO2 100%   BMI 30.90 kg/m   Physical Exam  Constitutional: She is oriented to person, place, and time. She appears well-developed and well-nourished. No distress.  HENT:  Head: Normocephalic and atraumatic.  Neck: Normal range of motion. Neck supple.  Cardiovascular: Normal rate and regular rhythm.  Exam reveals no gallop and no friction rub.   No murmur heard. Pulmonary/Chest: Effort normal and breath sounds normal. No respiratory distress. She has no wheezes.  Abdominal: Soft. Bowel sounds are normal. She exhibits no distension. There is tenderness. There is no rebound and no guarding.  There is mild tenderness to the suprapubic region.  Musculoskeletal: Normal range of motion.  Neurological: She is alert and oriented to person, place, and  time.  Skin: Skin is warm and dry. She is not diaphoretic.  Nursing note and vitals reviewed.    ED Treatments / Results  Labs (all labs ordered are listed, but only abnormal results are displayed) Labs Reviewed  URINALYSIS, ROUTINE W REFLEX MICROSCOPIC  PREGNANCY, URINE  CBC WITH DIFFERENTIAL/PLATELET  COMPREHENSIVE METABOLIC PANEL  LIPASE, BLOOD  TROPONIN I    EKG  EKG Interpretation  Date/Time:  Tuesday September 24 2016 09:05:47 EDT Ventricular Rate:  60 PR  Interval:    QRS Duration: 144 QT Interval:  420 QTC Calculation: 420 R Axis:   102 Text Interpretation:  Sinus rhythm Borderline short PR interval Right bundle Cobert block Baseline wander in lead(s) I III aVL Confirmed by Geoffery LyonseLo, Catharina Pica ([id number]) on 09/24/2016 9:30:19 AM       Radiology No results found.  Procedures Procedures (including critical care time)  Medications Ordered in ED Medications - No data to display   Initial Impression / Assessment and Plan / ED Course  I have reviewed the triage vital signs and the nursing notes.  Pertinent labs & imaging results that were available during my care of the patient were reviewed by me and considered in my medical decision making (see chart for details).  Patient presents here with complaints of abdominal discomfort and passing a clot through her vagina this morning. She also complains of pain in her chest that appears more anxiety related. She seems to be quite upset about her abdominal issues. Her EKG shows a right bundle Kleve block which I suspect his baseline as I have no prior ECGs.  She is somewhat anemic, however her MCV is low and I suspect this to be chronic. Ultrasound shows a thickened endometrium with some pelvic free fluid and bilateral ovarian cysts. Her pregnancy test is negative thus ruling out an ectopic.  I see no indication for transfusion or emergent intervention. She will require GYN follow-up and will be given the number for the women's outpatient clinic. She will also be given medicine for her pain and advised to return if symptoms worsen or change.  Final Clinical Impressions(s) / ED Diagnoses   Final diagnoses:  None    New Prescriptions New Prescriptions   No medications on file     Geoffery Lyonselo, Athanasius Kesling, MD 09/24/16 1120

## 2016-09-24 NOTE — Discharge Instructions (Signed)
Tramadol as prescribed as needed for pain.  Follow-up with gynecology in the next 1-2 weeks. The contact information for the women's outpatient clinic has been provided in this discharge summary for you to call and make these arrangements.  Return to the emergency department if you develop worsening pain, worsening bleeding, dizziness, high fevers, or other new and concerning symptoms.

## 2017-08-11 IMAGING — CR DG CHEST 2V
2 series · 2 of 2 positions shown · non-contrast
Comparison: None.

CLINICAL DATA: Abdominal cramping, chest pain

EXAM:
CHEST  2 VIEW

[w chest pa]
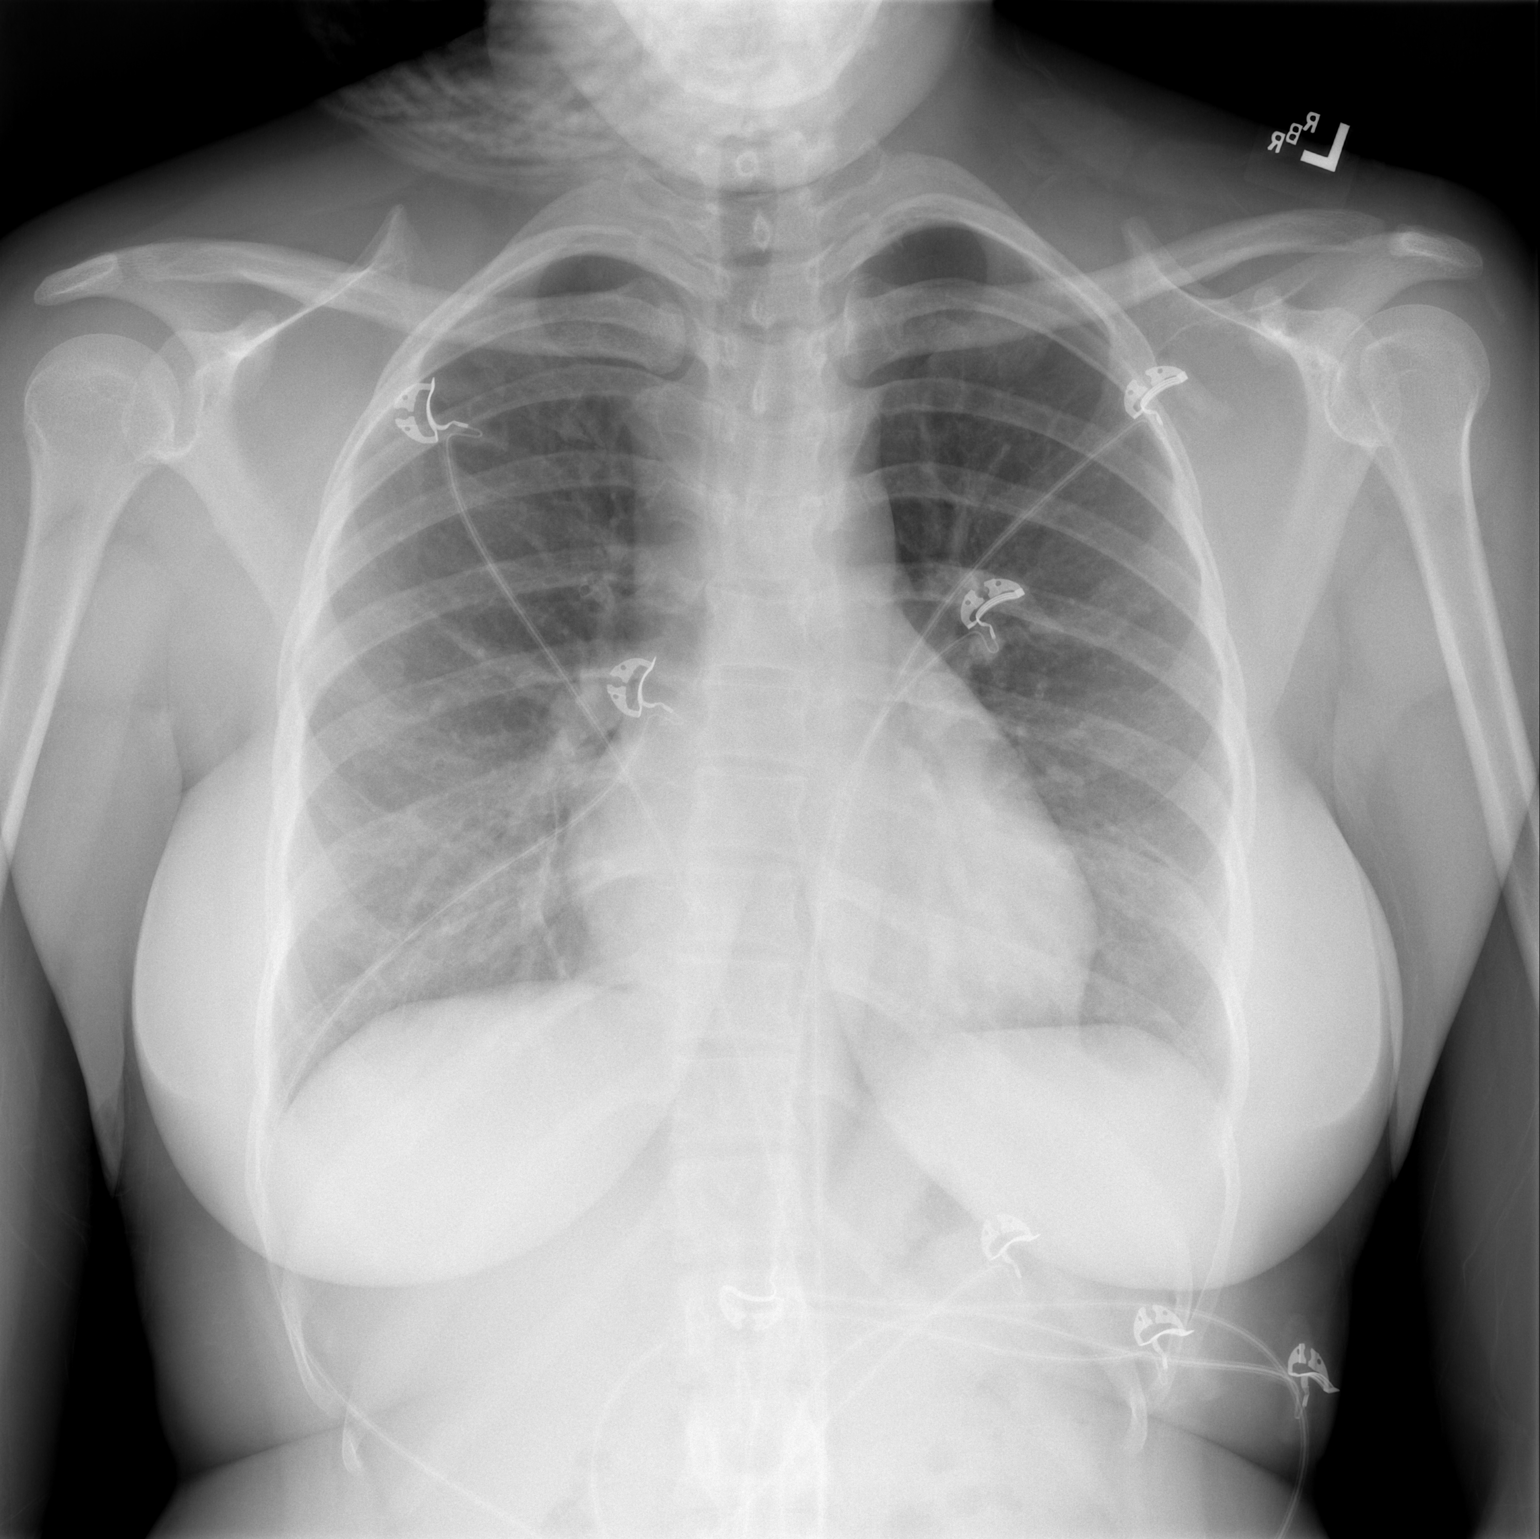

[w chest lat]
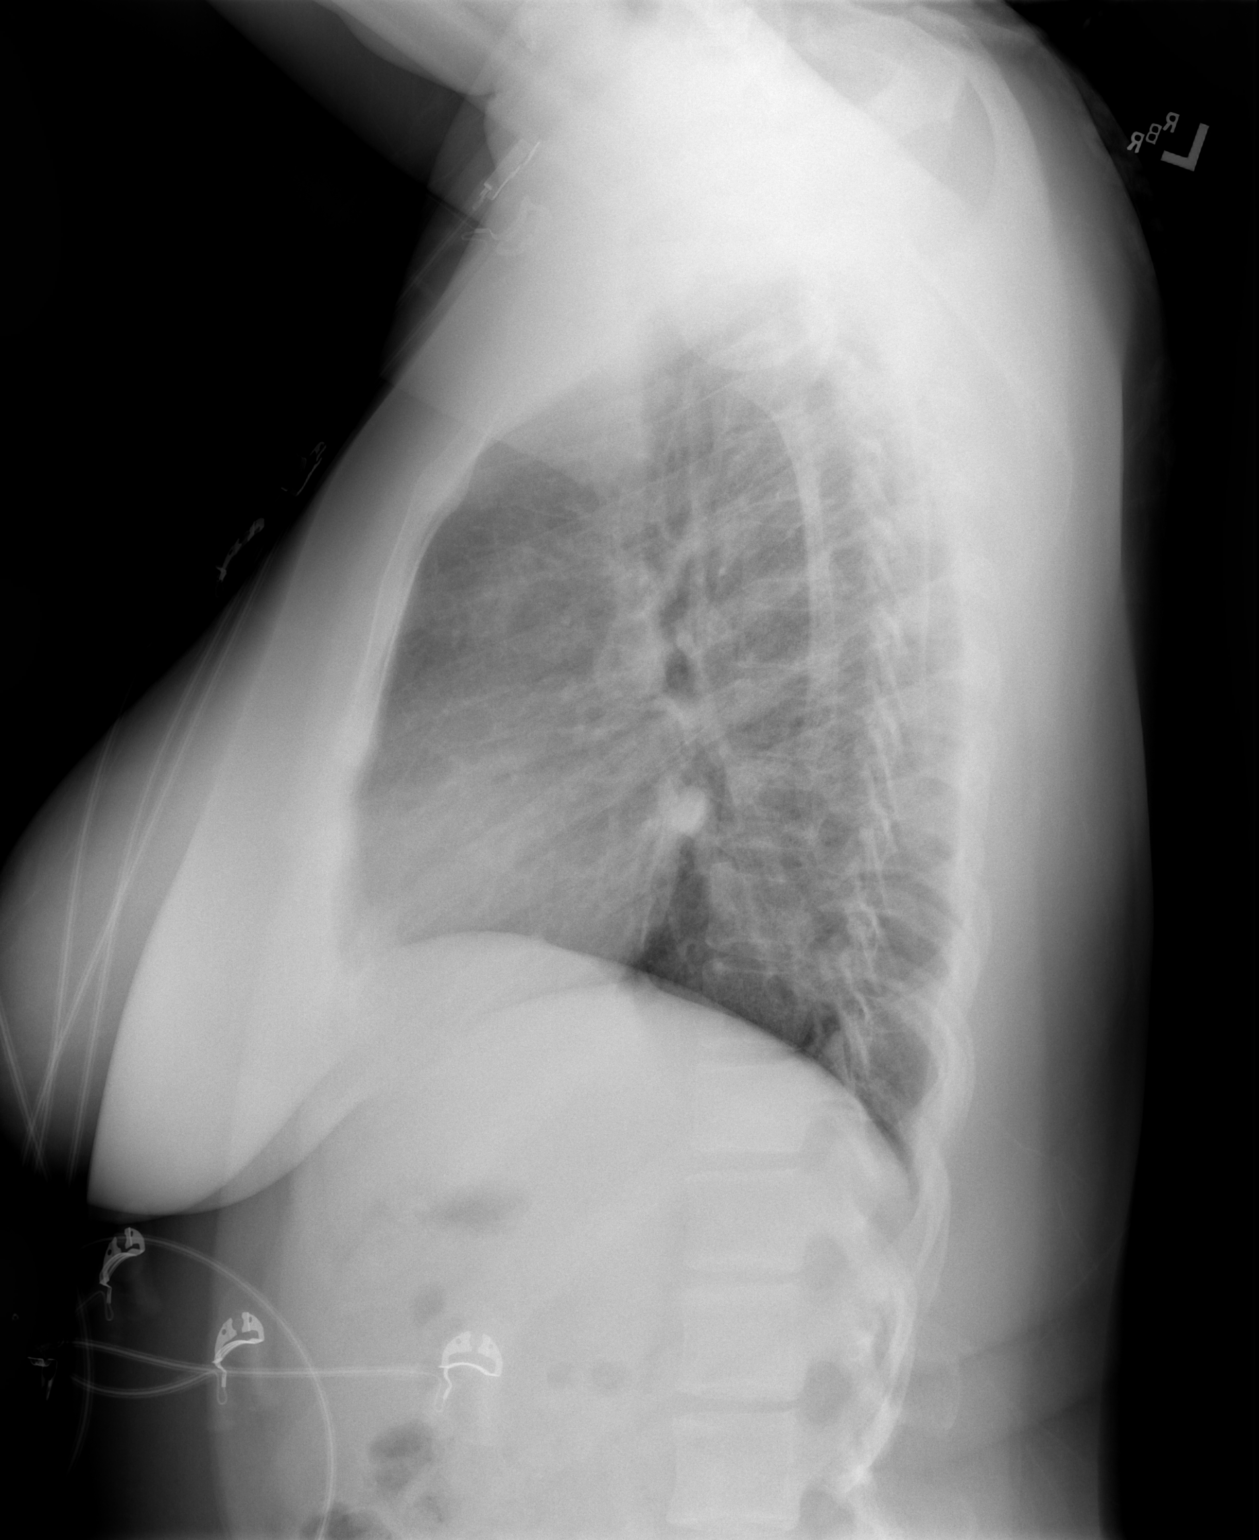

[2 of 2 positions shown; findings below may reference images not displayed]

FINDINGS: The heart size and mediastinal contours are within normal limits.
Both lungs are clear. The visualized skeletal structures are
unremarkable.
IMPRESSION: No active cardiopulmonary disease.

## 2018-02-16 IMAGING — US US TRANSVAGINAL NON-OB
1 series · 13 of 25 positions shown · non-contrast
Comparison: No recent prior.

CLINICAL DATA: Past large vaginal what 2 days ago. Pelvic cramping.

EXAM:
TRANSABDOMINAL AND TRANSVAGINAL ULTRASOUND OF PELVIS
TECHNIQUE: Both transabdominal and transvaginal ultrasound examinations of the
pelvis were performed. Transabdominal technique was performed for
global imaging of the pelvis including uterus, ovaries, adnexal
regions, and pelvic cul-de-sac. It was necessary to proceed with
endovaginal exam following the transabdominal exam to visualize the
uterus and ovaries.

[Series 1: us transvaginal non-ob · 0.21mm/px · 52 acquisitions, 13 frames shown]
[im 1/52]
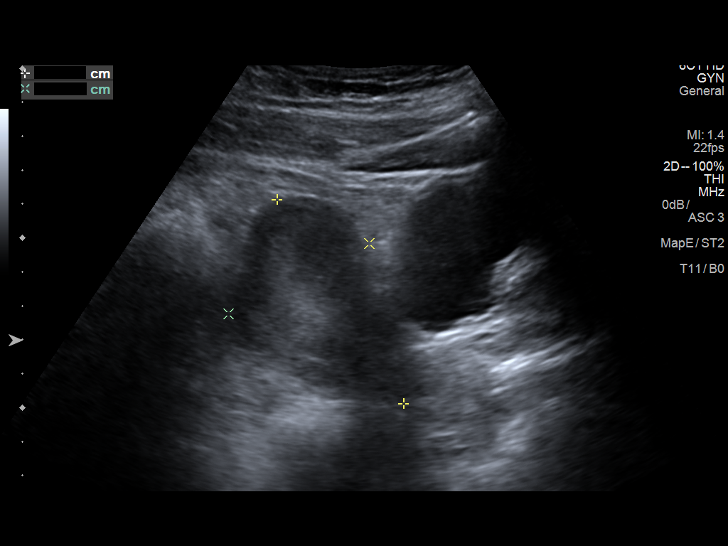
[im 5/52]
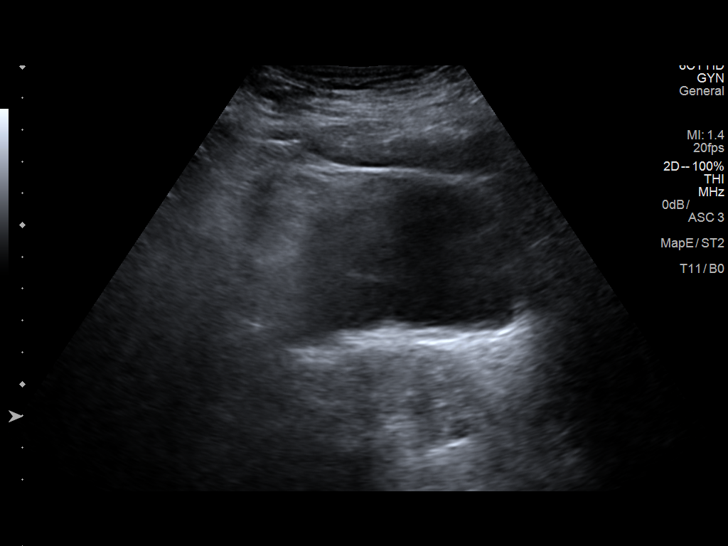
[im 9/52]
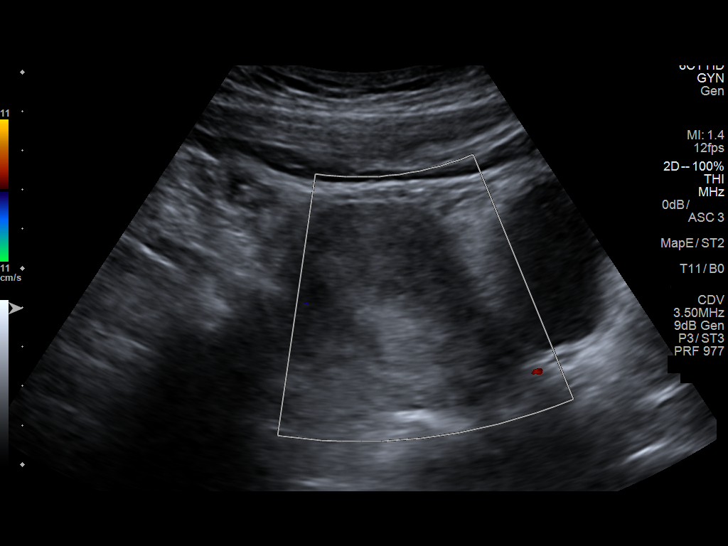
[im 13/52]
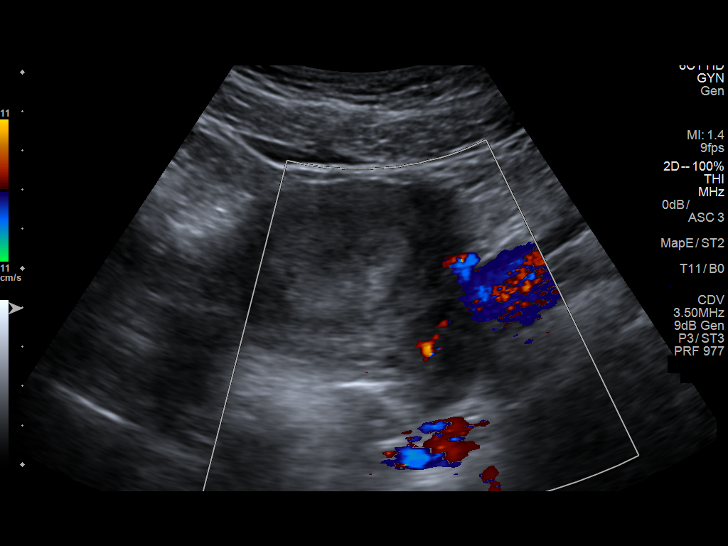
[im 18/52]
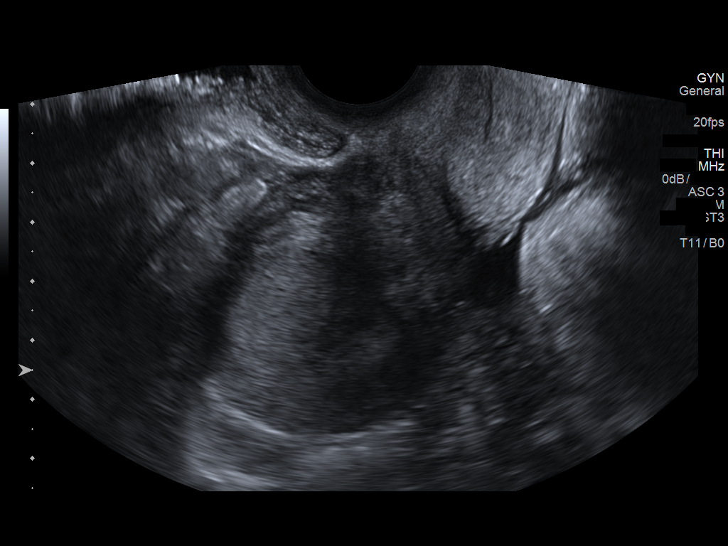
[im 22/52]
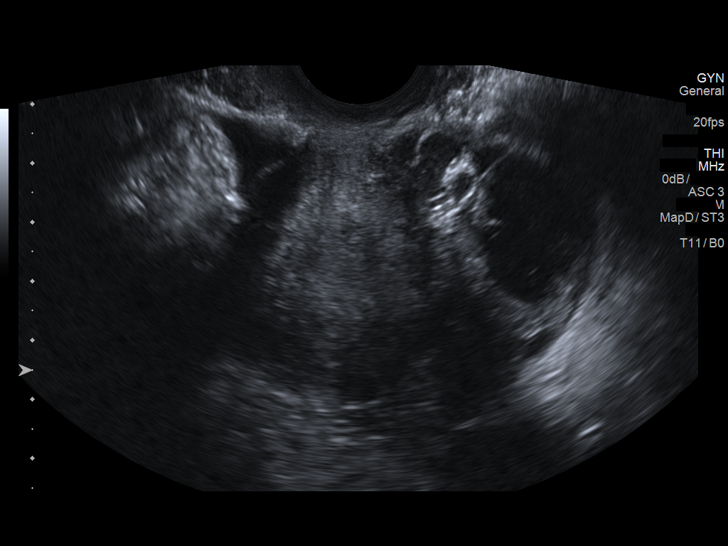
[im 26/52]
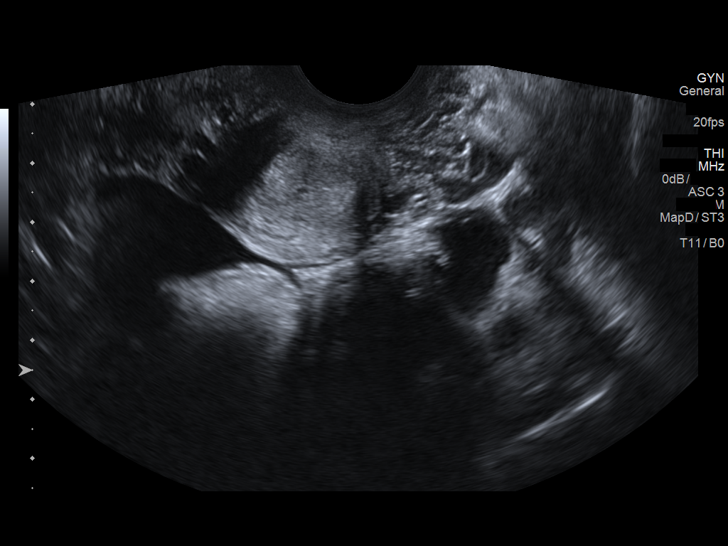
[im 30/52]
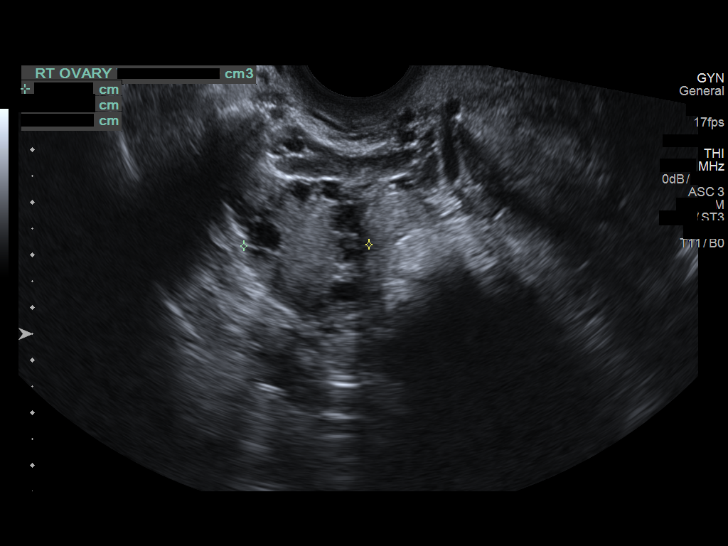
[im 35/52]
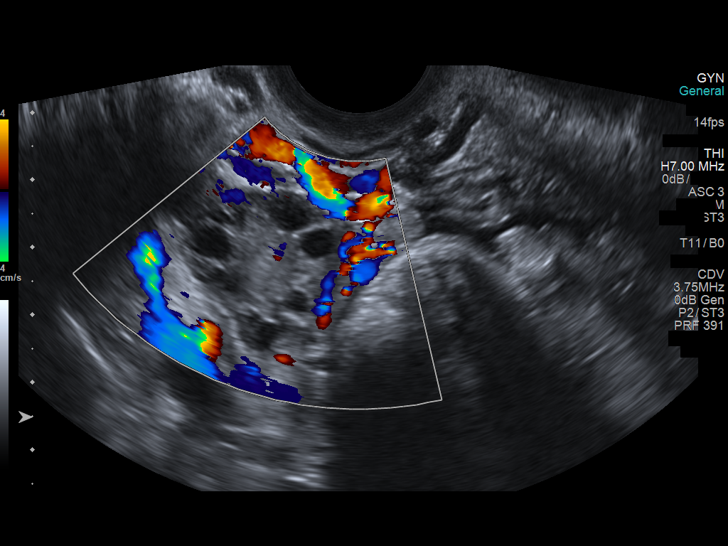
[im 39/52]
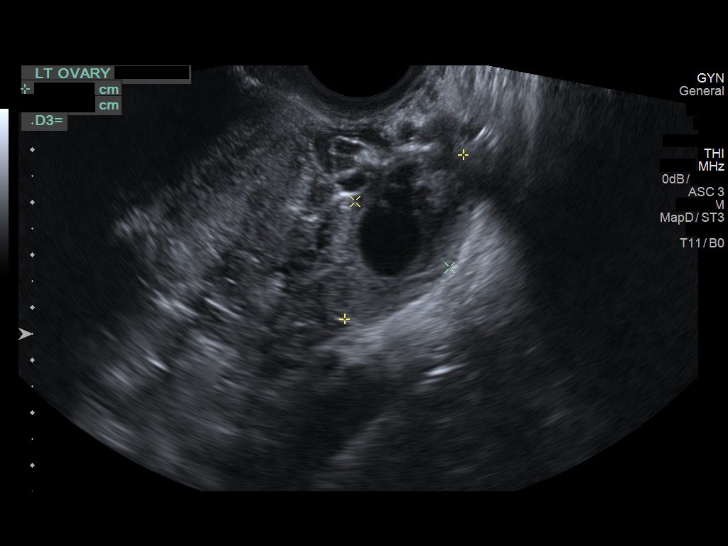
[im 43/52]
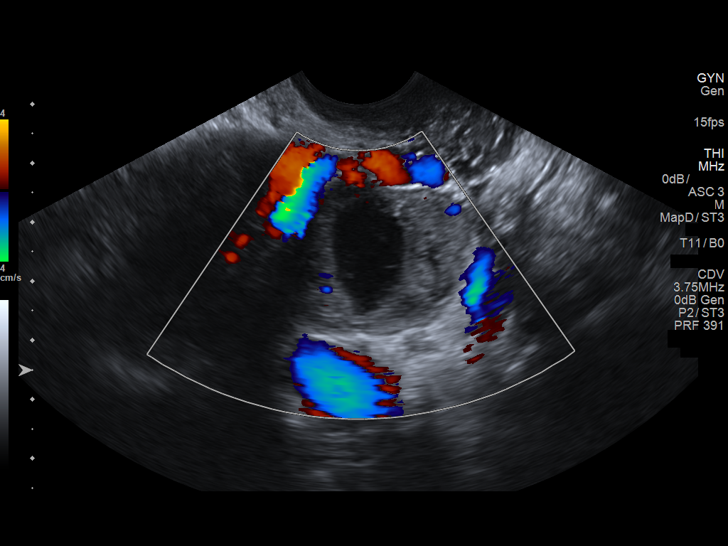
[im 47/52]
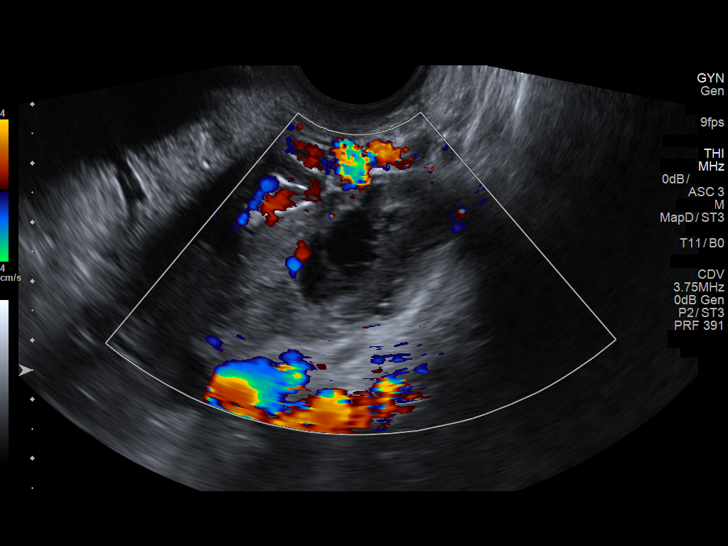
[im 52/52]
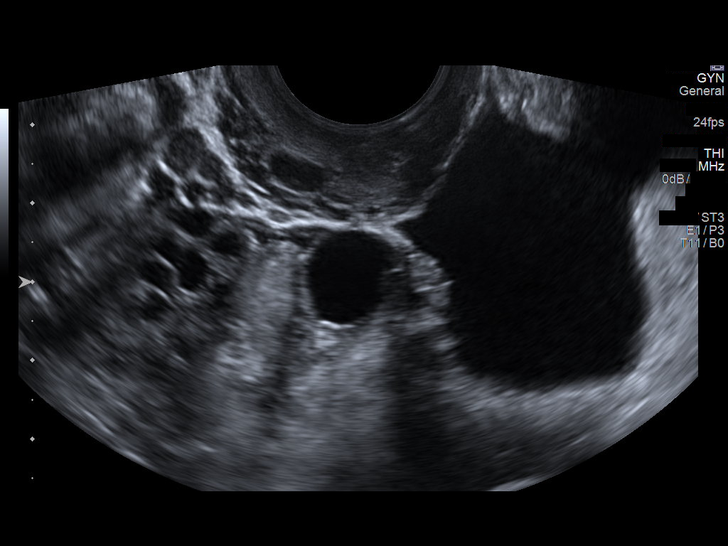

[13 of 25 positions shown; findings below may reference images not displayed]

FINDINGS: Uterus

Measurements: 9.2 x 4.5 x 5.3 cm. No fibroids or other mass
visualized.

Endometrium

Thickness: 2.8 cm.  No focal abnormality identified.

Right ovary

Measurements: 3.4 x 2.4 x 2.4 cm. A 0.9 x 1.3 x 1 cm right
paraovarian cyst versus hydrosalpinx noted.

Left ovary

Measurements: 3.9 x 2.2 x 2.8 cm. 1.6x 1.1 x 1.1 cm complex cyst.
Most likely corpus luteal cyst.

Other findings

Moderate free pelvic fluid.
IMPRESSION: 1. Very thickened endometrium at 2.8 cm. Follow-up exam to
demonstrate resolution suggested . If bleeding remains unresponsive
to hormonal or medical therapy, focal lesion work-up with
sonohysterogram should be considered. Endometrial biopsy should also
be considered in pre-menopausal patients at high risk for
endometrial carcinoma. (Ref: Radiological Reasoning: Algorithmic
Workup of Abnormal Vaginal Bleeding with Endovaginal Sonography and
Sonohysterography. AJR 1669; 191:S68-73)

2. 0.9 x 1.3 x 1.0 cm right paraovarian cyst versus hydrosalpinx.
1.6 x 1.1 x 1.1 cm complex left ovarian cyst, most likely corpus
luteal cyst. Moderate free pelvic fluid. Pregnancy test suggested to
exclude ectopic pregnancy.

## 2021-04-24 ENCOUNTER — Other Ambulatory Visit: Payer: Self-pay

## 2021-04-24 ENCOUNTER — Encounter (HOSPITAL_BASED_OUTPATIENT_CLINIC_OR_DEPARTMENT_OTHER): Payer: Self-pay

## 2021-04-24 ENCOUNTER — Emergency Department (HOSPITAL_BASED_OUTPATIENT_CLINIC_OR_DEPARTMENT_OTHER)
Admission: EM | Admit: 2021-04-24 | Discharge: 2021-04-24 | Disposition: A | Discharge status: Home / Self Care | Payer: Self-pay | Attending: Emergency Medicine | Admitting: Emergency Medicine

## 2021-04-24 DIAGNOSIS — R519 Headache, unspecified: Secondary | ICD-10-CM | POA: Insufficient documentation

## 2021-04-24 DIAGNOSIS — K029 Dental caries, unspecified: Secondary | ICD-10-CM | POA: Insufficient documentation

## 2021-04-24 DIAGNOSIS — Z79899 Other long term (current) drug therapy: Secondary | ICD-10-CM | POA: Insufficient documentation

## 2021-04-24 DIAGNOSIS — K0889 Other specified disorders of teeth and supporting structures: Secondary | ICD-10-CM | POA: Insufficient documentation

## 2021-04-24 MED ORDER — AMOXICILLIN-POT CLAVULANATE 875-125 MG PO TABS: 1.0000 | ORAL_TABLET | Freq: Two times a day (BID) | ORAL | 0 refills | Status: AC

## 2021-04-24 MED ORDER — OXYCODONE-ACETAMINOPHEN 5-325 MG PO TABS: 1.0000 | ORAL_TABLET | Freq: Four times a day (QID) | ORAL | 0 refills | Status: AC | PRN

## 2021-04-24 MED ORDER — IBUPROFEN 800 MG PO TABS: 800.0000 mg | ORAL_TABLET | Freq: Three times a day (TID) | ORAL | 0 refills | Status: AC | PRN

## 2021-04-24 NOTE — Discharge Instructions (Addendum)
Take Ibuprofen every 8 hours for pain Take Percocet every 6 hours for pain as needed Take Augmentin two times a day for 7 days  Call the Dental office provided to schedule an appointment.

## 2021-04-24 NOTE — ED Provider Notes (Signed)
[facility]PART[facility] Provider Note   CSN: [id number] Arrival date & time: 04/24/21  1355  History Chief Complaint  Patient presents with   Dental Pain    Anne Thompson is a 31 y.o. female.  Reports left lower dental pain since yesterday. Heat/Cold sensitivity.  Difficulty chewing.  Pain causing left side headache.  Took Aleve without relief.  Also endorses sensitivity to upper teeth on left side.  Denies any fevers, difficulty swallowing.  Has not been able to see dentist in some time.  Has had similar symptoms on right side in past.      Home Medications Prior to Admission medications   Medication Sig Start Date End Date Taking? Authorizing Provider  amoxicillin-clavulanate (AUGMENTIN) 875-125 MG tablet Take 1 tablet by mouth 2 (two) times daily for 7 days. 04/24/21 05/01/21 Yes Carollee Leitz, MD  ibuprofen (ADVIL) 800 MG tablet Take 1 tablet (800 mg total) by mouth every 8 (eight) hours as needed for up to 5 days for moderate pain or mild pain. 04/24/21 04/29/21 Yes Carollee Leitz, MD  oxyCODONE-acetaminophen (PERCOCET) 5-325 MG tablet Take 1 tablet by mouth every 6 (six) hours as needed for up to 3 days for severe pain. 04/24/21 04/27/21 Yes Carollee Leitz, MD  traMADol (ULTRAM) 50 MG tablet Take 1 tablet (50 mg total) by mouth every 6 (six) hours as needed. 09/24/16   Anne Speak, MD      Allergies    Patient has no known allergies.    Review of Systems   Review of Systems  Constitutional:  Negative for appetite change, chills, fatigue, fever and unexpected weight change.  HENT:  Positive for dental problem and facial swelling. Negative for drooling, sore throat and trouble swallowing.    Physical Exam Updated Vital Signs BP (!) 127/101 (BP Location: Left Arm)    Pulse 73    Temp 98 F (36.7 C) (Oral)    Resp 18    Ht 5\' 4"  (1.626 m)    Wt 102.1 kg    LMP 04/03/2021    SpO2 100%    BMI 38.62 kg/m  Physical Exam Constitutional:      General: She is not in  acute distress.    Appearance: She is obese.  HENT:     Head: Normocephalic.     Jaw: There is normal jaw occlusion. No pain on movement.     Nose: Nose normal.     Mouth/Throat:     Lips: Pink.     Mouth: Mucous membranes are moist.     Dentition: Abnormal dentition. Dental tenderness, gingival swelling, dental caries and dental abscesses present. No gum lesions.     Tongue: No lesions.     Palate: No mass.     Pharynx: Oropharynx is clear. Uvula midline.     Comments: Two impacted left lower molars, multiple dental caries.  Left lower molar cracked.  Mild gingival erythema. Cardiovascular:     Rate and Rhythm: Normal rate.     Pulses: Normal pulses.  Pulmonary:     Effort: Pulmonary effort is normal.  Musculoskeletal:     Cervical back: Normal range of motion. No tenderness.  Lymphadenopathy:     Cervical: No cervical adenopathy.  Skin:    General: Skin is warm.  Neurological:     Mental Status: She is alert.    ED Results / Procedures / Treatments   Labs (all labs ordered are listed, but only abnormal results are displayed) Labs Reviewed - No  data to display  EKG None  Radiology No results found.  Medications Ordered in ED Medications - No data to display  ED Course/ Medical Decision Making/ A&P  [name] is a 31 y.o. female who presented to the ED with left lower dental pain. Afebrile and hemodynamically stable. Impacted lower molars and caries likely causing pain.  Cannot rule out small underlying abscess. Will treat with Augmentin x 5 days, Percocet 5-325 mg q6h x 3day for severe pain and Ibuprofen 800 mg q8h as needed.  Discussed with patient that will need to see dentist as soon as possible for evaluation of impacted teeth and possible nerve exposure.   Medical Decision Making  Final Clinical Impression(s) / ED Diagnoses Final diagnoses:  Pain, dental  Dental caries    Rx / DC Orders ED Discharge Orders          Ordered     amoxicillin-clavulanate (AUGMENTIN) 875-125 MG tablet  2 times daily        04/24/21 1436    oxyCODONE-acetaminophen (PERCOCET) 5-325 MG tablet  Every 6 hours PRN        04/24/21 1436    ibuprofen (ADVIL) 800 MG tablet  Every 8 hours PRN        04/24/21 1436              Carollee Leitz, MD 04/24/21 1459    Margette Fast, MD 04/26/21 [phone]

## 2021-04-24 NOTE — ED Triage Notes (Signed)
Pt c/o left lower dental pain x 2 days-NAD-steady gait
